# Patient Record
Sex: Male | Born: 1965
Health system: Southern US, Community
[De-identification: ages and names within clinical notes are randomized; demographics above are authoritative.]

## PROBLEM LIST (undated history)

## (undated) DIAGNOSIS — Z789 Other specified health status: Secondary | ICD-10-CM

## (undated) HISTORY — PX: EYE SURGERY: SHX253

## (undated) HISTORY — DX: Other specified health status: Z78.9

---

## 2011-07-24 ENCOUNTER — Encounter (HOSPITAL_BASED_OUTPATIENT_CLINIC_OR_DEPARTMENT_OTHER): Payer: Self-pay | Admitting: *Deleted

## 2011-07-24 ENCOUNTER — Emergency Department (HOSPITAL_BASED_OUTPATIENT_CLINIC_OR_DEPARTMENT_OTHER)
Admission: EM | Admit: 2011-07-24 | Discharge: 2011-07-24 | Disposition: A | Discharge status: Home / Self Care | Payer: No Typology Code available for payment source | Attending: Emergency Medicine | Admitting: Emergency Medicine

## 2011-07-24 DIAGNOSIS — Z043 Encounter for examination and observation following other accident: Secondary | ICD-10-CM | POA: Insufficient documentation

## 2011-07-24 NOTE — ED Notes (Signed)
MD at bedside. 
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
## 2015-12-15 DIAGNOSIS — H524 Presbyopia: Secondary | ICD-10-CM | POA: Diagnosis not present

## 2015-12-15 DIAGNOSIS — H5203 Hypermetropia, bilateral: Secondary | ICD-10-CM | POA: Diagnosis not present

## 2016-08-26 ENCOUNTER — Ambulatory Visit (AMBULATORY_SURGERY_CENTER): Payer: Self-pay

## 2016-08-26 VITALS — Ht 72.0 in | Wt 203.0 lb

## 2016-08-26 DIAGNOSIS — Z1211 Encounter for screening for malignant neoplasm of colon: Secondary | ICD-10-CM

## 2016-08-26 MED ORDER — NA SULFATE-K SULFATE-MG SULF 17.5-3.13-1.6 GM/177ML PO SOLN: 1.0000 | Freq: Once | ORAL | 0 refills | Status: AC

## 2016-08-26 MED FILL — SUPREP BOWEL PREP KIT: 17.5-3.13-1 | 2 days supply | Qty: 354 | Fill #0

## 2016-08-26 NOTE — Progress Notes (Signed)
Denies allergies to eggs or soy products. Denies complication of anesthesia or sedation. Denies use of weight loss medication. Denies use of O2.   Emmi instructions declined.  

## 2016-09-06 ENCOUNTER — Encounter: Payer: Self-pay | Admitting: Internal Medicine

## 2016-09-20 ENCOUNTER — Encounter: Payer: Self-pay | Admitting: Internal Medicine

## 2016-09-20 ENCOUNTER — Ambulatory Visit (AMBULATORY_SURGERY_CENTER): Payer: 59 | Admitting: Internal Medicine

## 2016-09-20 VITALS — BP 136/83 | HR 56 | Temp 97.5°F | Resp 13 | Ht 72.0 in | Wt 203.0 lb

## 2016-09-20 DIAGNOSIS — Z1212 Encounter for screening for malignant neoplasm of rectum: Secondary | ICD-10-CM | POA: Diagnosis not present

## 2016-09-20 DIAGNOSIS — Z1211 Encounter for screening for malignant neoplasm of colon: Secondary | ICD-10-CM

## 2016-09-20 MED ORDER — SODIUM CHLORIDE 0.9 % IV SOLN: 500.0000 mL | INTRAVENOUS | Status: DC

## 2016-09-20 NOTE — Patient Instructions (Signed)
YOU HAD AN ENDOSCOPIC PROCEDURE TODAY AT THE [facility]:   Refer to the procedure report that was given to you for any specific questions about what was found during the examination.  If the procedure report does not answer your questions, please call your gastroenterologist to clarify.  If you requested that your care partner not be given the details of your procedure findings, then the procedure report has been included in a sealed envelope for you to review at your convenience later.  YOU SHOULD EXPECT: Some feelings of bloating in the abdomen. Passage of more gas than usual.  Walking can help get rid of the air that was put into your GI tract during the procedure and reduce the bloating. If you had a lower endoscopy (such as a colonoscopy or flexible sigmoidoscopy) you may notice spotting of blood in your stool or on the toilet paper. If you underwent a bowel prep for your procedure, you may not have a normal bowel movement for a few days.  Please Note:  You might notice some irritation and congestion in your nose or some drainage.  This is from the oxygen used during your procedure.  There is no need for concern and it should clear up in a day or so.  SYMPTOMS TO REPORT IMMEDIATELY:   Following lower endoscopy (colonoscopy or flexible sigmoidoscopy):  Excessive amounts of blood in the stool  Significant tenderness or worsening of abdominal pains  Swelling of the abdomen that is new, acute  Fever of 100F or higher  For urgent or emergent issues, a gastroenterologist can be reached at any hour by calling [phone].   DIET:  We do recommend a small meal at first, but then you may proceed to your regular diet.  Drink plenty of fluids but you should avoid alcoholic beverages for 24 hours.  ACTIVITY:  You should plan to take it easy for the rest of today and you should NOT DRIVE or use heavy machinery until tomorrow (because of the sedation medicines used during the test).     FOLLOW UP: Our staff will call the number listed on your records the next business day following your procedure to check on you and address any questions or concerns that you may have regarding the information given to you following your procedure. If we do not reach you, we will leave a message.  However, if you are feeling well and you are not experiencing any problems, there is no need to return our call.  We will assume that you have returned to your regular daily activities without incident.  If any biopsies were taken you will be contacted by phone or by letter within the next 1-3 weeks.  Please call us at [phone] if you have not heard about the biopsies in 3 weeks.   Hemorrhoids (handout given) Repeat Colonoscopy in 10 years   SIGNATURES/CONFIDENTIALITY: You and/or your care partner have signed paperwork which will be entered into your electronic medical record.  These signatures attest to the fact that that the information above on your After Visit Summary has been reviewed and is understood.  Full responsibility of the confidentiality of this discharge information lies with you and/or your care-partner.

## 2016-09-20 NOTE — Progress Notes (Signed)
Pt's states no medical or surgical changes since previsit or office visit. maw 

## 2016-09-20 NOTE — Progress Notes (Signed)
Alert and oriented x3, pleased with MAC, report to RN celia 

## 2016-09-20 NOTE — Op Note (Signed)
[name] Procedure Date: 09/20/2016 10:47 AM MRN: [id number] Endoscopist: Beverley Fiedler , MD Age: 51 Referring MD:  Date of Birth: 22-Oct-1965 Gender: Male Account #: [id number] Procedure:                Colonoscopy Indications:              Screening for colorectal malignant neoplasm, This                            is the patient's first colonoscopy Medicines:                Monitored Anesthesia Care Procedure:                Pre-Anesthesia Assessment:                           - Prior to the procedure, a History and Physical                            was performed, and patient medications and                            allergies were reviewed. The patient's tolerance of                            previous anesthesia was also reviewed. The risks                            and benefits of the procedure and the sedation                            options and risks were discussed with the patient.                            All questions were answered, and informed consent                            was obtained. Prior Anticoagulants: The patient has                            taken no previous anticoagulant or antiplatelet                            agents. ASA Grade Assessment: I - A normal, healthy                            patient. After reviewing the risks and benefits,                            the patient was deemed in satisfactory condition to                            undergo the procedure.  After obtaining informed consent, the colonoscope                            was passed under direct vision. Throughout the                            procedure, the patient's blood pressure, pulse, and                            oxygen saturations were monitored continuously. The                            Colonoscope was introduced through the anus and                            advanced to the the cecum, identified by                             appendiceal orifice and ileocecal valve. The                            colonoscopy was performed without difficulty. The                            patient tolerated the procedure well. The quality                            of the bowel preparation was good. The terminal                            ileum, ileocecal valve, appendiceal orifice, and                            rectum were photographed. Scope In: 11:04:20 AM Scope Out: 11:16:36 AM Scope Withdrawal Time: 0 hours 10 minutes 18 seconds  Total Procedure Duration: 0 hours 12 minutes 16 seconds  Findings:                 The digital rectal exam was normal.                           The terminal ileum appeared normal.                           The colon (entire examined portion) appeared normal.                           Internal hemorrhoids were found during                            retroflexion. The hemorrhoids were small. Complications:            No immediate complications. Estimated Blood Loss:     Estimated blood loss: none. Impression:               - The examined portion of the ileum  was normal.                           - The entire examined colon is normal.                           - Small internal hemorrhoids.                           - No specimens collected. Recommendation:           - Patient has a contact number available for                            emergencies. The signs and symptoms of potential                            delayed complications were discussed with the                            patient. Return to normal activities tomorrow.                            Written discharge instructions were provided to the                            patient.                           - Resume previous diet.                           - Continue present medications.                           - Repeat colonoscopy in 10 years for screening                            purposes. Beverley FiedlerJay M Nick Armel,  MD 09/20/2016 11:19:03 AM This report has been signed electronically.

## 2016-09-21 ENCOUNTER — Telehealth: Payer: Self-pay | Admitting: *Deleted

## 2016-09-21 ENCOUNTER — Telehealth: Payer: Self-pay

## 2016-09-21 NOTE — Telephone Encounter (Signed)
No answer, message left for the patient. 

## 2016-09-21 NOTE — Telephone Encounter (Signed)
  Follow up Call-  Call back number 09/20/2016  Post procedure Call Back phone  # #[phone] cell  Permission to leave phone message Yes  Some recent data might be hidden     Patient questions:  Do you have a fever, pain , or abdominal swelling? No. Pain Score  0 *  Have you tolerated food without any problems? Yes.    Have you been able to return to your normal activities? Yes.    Do you have any questions about your discharge instructions: Diet   No. Medications  No. Follow up visit  No.  Do you have questions or concerns about your Care? No.  Actions: * If pain score is 4 or above: No action needed, pain <4.

## 2017-01-17 DIAGNOSIS — H5203 Hypermetropia, bilateral: Secondary | ICD-10-CM | POA: Diagnosis not present

## 2017-01-17 DIAGNOSIS — H524 Presbyopia: Secondary | ICD-10-CM | POA: Diagnosis not present

## 2017-09-18 ENCOUNTER — Ambulatory Visit (INDEPENDENT_AMBULATORY_CARE_PROVIDER_SITE_OTHER): Payer: 59 | Admitting: Family Medicine

## 2017-09-18 ENCOUNTER — Encounter: Payer: Self-pay | Admitting: Family Medicine

## 2017-09-18 VITALS — BP 132/86 | HR 69 | Temp 98.4°F | Ht 72.0 in | Wt 207.0 lb

## 2017-09-18 DIAGNOSIS — Z2821 Immunization not carried out because of patient refusal: Secondary | ICD-10-CM

## 2017-09-18 DIAGNOSIS — Z114 Encounter for screening for human immunodeficiency virus [HIV]: Secondary | ICD-10-CM | POA: Diagnosis not present

## 2017-09-18 DIAGNOSIS — Z0000 Encounter for general adult medical examination without abnormal findings: Secondary | ICD-10-CM | POA: Diagnosis not present

## 2017-09-18 DIAGNOSIS — L309 Dermatitis, unspecified: Secondary | ICD-10-CM

## 2017-09-18 LAB — COMPREHENSIVE METABOLIC PANEL
ALBUMIN: 4.5 g/dL (ref 3.5–5.2)
ALK PHOS: 58 U/L (ref 39–117)
ALT: 32 U/L (ref 0–53)
AST: 23 U/L (ref 0–37)
BUN: 14 mg/dL (ref 6–23)
CO2: 28 mEq/L (ref 19–32)
Calcium: 9.7 mg/dL (ref 8.4–10.5)
Chloride: 104 mEq/L (ref 96–112)
Creatinine, Ser: 1.1 mg/dL (ref 0.40–1.50)
GFR: 75.05 mL/min (ref >60.00)
Glucose, Bld: 88 mg/dL (ref 70–99)
Potassium: 4.1 mEq/L (ref 3.5–5.1)
Sodium: 139 mEq/L (ref 135–145)
Total Bilirubin: 0.8 mg/dL (ref 0.2–1.2)
Total Protein: 7 g/dL (ref 6.0–8.3)

## 2017-09-18 LAB — LIPID PANEL
CHOLESTEROL: 203 mg/dL — AB (ref 0–200)
HDL: 35 mg/dL — AB (ref >39.00)
LDL Cholesterol: 136 mg/dL — ABNORMAL HIGH (ref 0–99)
NONHDL: 168.36
Total CHOL/HDL Ratio: 6
Triglycerides: 160 mg/dL — ABNORMAL HIGH (ref 0.0–149.0)
VLDL: 32 mg/dL (ref 0.0–40.0)

## 2017-09-18 MED ORDER — TRIAMCINOLONE ACETONIDE 0.1 % EX CREA: 1.0000 "application " | TOPICAL_CREAM | Freq: Two times a day (BID) | CUTANEOUS | 0 refills | Status: DC

## 2017-09-18 NOTE — Progress Notes (Signed)
Chief Complaint  Patient presents with  . New Patient (Initial Visit)    Well Male Derek Peterson is here for a complete physical.   His last physical was >1 year ago.  Current diet: in general, a "fair to poor" diet.  Current exercise: None currently Weight trend: Gaining  Does pt snore? No apneic episodes Daytime fatigue? No.  Seat belt? Yes.    Health maintenance Shingrix- No Colonoscopy- Yes Tetanus- No- refuses HIV- No Prostate cancer screening- No   Past Medical History:  Diagnosis Date  . No known health problems       Past Surgical History:  Procedure Laterality Date  . EYE SURGERY      Medications  Takes no meds routinely.   Allergies No Known Allergies  Family History Family History  Adopted: Yes  Family history unknown: Yes    Review of Systems: Constitutional:  no fevers Eye:  no recent significant change in vision Ear/Nose/Mouth/Throat:  Ears:  no hearing loss Nose/Mouth/Throat:  no complaints of nasal congestion, no sore throat Cardiovascular:  no chest pain, no palpitations Respiratory:  no cough and no shortness of breath Gastrointestinal:  no abdominal pain, no change in bowel habits GU:  Male: negative for dysuria, frequency, and incontinence and negative for prostate symptoms Musculoskeletal/Extremities:  no pain, redness, or swelling of the joints Integumentary (Skin/Breast): +itchy lesion on RLE; otherwise no abnormal skin lesions reported Neurologic:  no headaches Endocrine: No unexpected weight changes Hematologic/Lymphatic:  no abnormal bleeding  Exam BP 132/86 (BP Location: Left Arm, Patient Position: Sitting, Cuff Size: Large)   Pulse 69   Temp 98.4 F (36.9 C) (Oral)   Ht 6' (1.829 m)   Wt 207 lb (93.9 kg)   SpO2 97%   BMI 28.07 kg/m  General:  well developed, well nourished, in no apparent distress Skin: patch of erythema and excoriation on R lateral LE distally; otherwise no significant moles, warts, or growths Head:   no masses, lesions, or tenderness Eyes:  pupils equal and round, sclera anicteric without injection Ears:  canals without lesions, TMs shiny without retraction, no obvious effusion, no erythema Nose:  nares patent, septum midline, mucosa normal Throat/Pharynx:  lips and gingiva without lesion; tongue and uvula midline; non-inflamed pharynx; no exudates or postnasal drainage Neck: neck supple without adenopathy, thyromegaly, or masses Lungs:  clear to auscultation, breath sounds equal bilaterally, no respiratory distress Cardio:  regular rate and rhythm, no LE edema, no bruits Abdomen:  abdomen soft, nontender; bowel sounds normal; no masses or organomegaly Genital (male): circumcised penis, no lesions or discharge; testes present bilaterally without masses or tenderness Rectal: Deferred Musculoskeletal:  symmetrical muscle groups noted without atrophy or deformity Extremities:  no clubbing, cyanosis, or edema, no deformities, no skin discoloration Neuro:  gait normal; deep tendon reflexes normal and symmetric Psych: well oriented with normal range of affect and appropriate judgment/insight  Assessment and Plan  Well adult exam - Plan: Lipid panel, Comprehensive metabolic panel  Screening for HIV (human immunodeficiency virus) - Plan: HIV antibody  Dermatitis - Plan: triamcinolone cream (KENALOG) 0.1 %  Tetanus, diphtheria, and acellular pertussis (Tdap) vaccination declined   Well 52 y.o. male. Counseled on diet and exercise. Needs to get back in swing of things.  Counseled on risks and benefits of prostate cancer screening with PSA. The patient agrees to forego testing. Immunizations, labs, and further orders as above. Let us know if he changes mind about tdap. Rec'd he contact pharmacy if interested in Shingrix.  Follow up in 1 yr or prn. The patient voiced understanding and agreement to the plan.  Derek Roche [place], DO 09/18/17 1:50 PM

## 2017-09-18 NOTE — Progress Notes (Signed)
Pre visit review using our clinic review tool, if applicable. No additional management support is needed unless otherwise documented below in the visit note. 

## 2017-09-18 NOTE — Patient Instructions (Addendum)
Call your pharmacy to see about the availability of the new shingles vaccine (Shingrix).  Get back on the wagon for your eating and exercising.  Keep up the good work otherwise.  Let me know if you change your mind about the tetanus shot.  Let us know if you need anything.

## 2017-09-19 LAB — HIV ANTIBODY (ROUTINE TESTING W REFLEX): HIV 1&2 Ab, 4th Generation: NONREACTIVE

## 2017-09-26 MED FILL — TRIAMCINOLONE 0.1% CREAM: 0.1 | 30 days supply | Qty: 30 | Fill #0

## 2018-08-07 DIAGNOSIS — H524 Presbyopia: Secondary | ICD-10-CM | POA: Diagnosis not present

## 2018-08-07 DIAGNOSIS — H5203 Hypermetropia, bilateral: Secondary | ICD-10-CM | POA: Diagnosis not present

## 2018-08-07 DIAGNOSIS — H52222 Regular astigmatism, left eye: Secondary | ICD-10-CM | POA: Diagnosis not present

## 2018-09-07 ENCOUNTER — Emergency Department (HOSPITAL_COMMUNITY)
Admission: EM | Admit: 2018-09-07 | Discharge: 2018-09-08 | Disposition: A | Discharge status: Home / Self Care | Payer: 59 | Attending: Emergency Medicine | Admitting: Emergency Medicine

## 2018-09-07 ENCOUNTER — Emergency Department (HOSPITAL_COMMUNITY): Payer: 59

## 2018-09-07 ENCOUNTER — Encounter (HOSPITAL_COMMUNITY): Payer: Self-pay | Admitting: *Deleted

## 2018-09-07 ENCOUNTER — Other Ambulatory Visit: Payer: Self-pay

## 2018-09-07 DIAGNOSIS — R0789 Other chest pain: Secondary | ICD-10-CM | POA: Diagnosis not present

## 2018-09-07 DIAGNOSIS — Y929 Unspecified place or not applicable: Secondary | ICD-10-CM | POA: Diagnosis not present

## 2018-09-07 DIAGNOSIS — Y939 Activity, unspecified: Secondary | ICD-10-CM | POA: Diagnosis not present

## 2018-09-07 DIAGNOSIS — S80211A Abrasion, right knee, initial encounter: Secondary | ICD-10-CM | POA: Diagnosis not present

## 2018-09-07 DIAGNOSIS — R52 Pain, unspecified: Secondary | ICD-10-CM | POA: Diagnosis not present

## 2018-09-07 DIAGNOSIS — S42101A Fracture of unspecified part of scapula, right shoulder, initial encounter for closed fracture: Secondary | ICD-10-CM | POA: Insufficient documentation

## 2018-09-07 DIAGNOSIS — S42024A Nondisplaced fracture of shaft of right clavicle, initial encounter for closed fracture: Secondary | ICD-10-CM | POA: Insufficient documentation

## 2018-09-07 DIAGNOSIS — S0990XA Unspecified injury of head, initial encounter: Secondary | ICD-10-CM | POA: Diagnosis not present

## 2018-09-07 DIAGNOSIS — S80212A Abrasion, left knee, initial encounter: Secondary | ICD-10-CM | POA: Insufficient documentation

## 2018-09-07 DIAGNOSIS — Z23 Encounter for immunization: Secondary | ICD-10-CM | POA: Diagnosis not present

## 2018-09-07 DIAGNOSIS — S3991XA Unspecified injury of abdomen, initial encounter: Secondary | ICD-10-CM | POA: Diagnosis not present

## 2018-09-07 DIAGNOSIS — Y999 Unspecified external cause status: Secondary | ICD-10-CM | POA: Diagnosis not present

## 2018-09-07 DIAGNOSIS — S199XXA Unspecified injury of neck, initial encounter: Secondary | ICD-10-CM | POA: Diagnosis not present

## 2018-09-07 DIAGNOSIS — S299XXA Unspecified injury of thorax, initial encounter: Secondary | ICD-10-CM | POA: Diagnosis not present

## 2018-09-07 DIAGNOSIS — T07XXXA Unspecified multiple injuries, initial encounter: Secondary | ICD-10-CM | POA: Diagnosis present

## 2018-09-07 DIAGNOSIS — I10 Essential (primary) hypertension: Secondary | ICD-10-CM | POA: Diagnosis not present

## 2018-09-07 DIAGNOSIS — S42111A Displaced fracture of body of scapula, right shoulder, initial encounter for closed fracture: Secondary | ICD-10-CM | POA: Diagnosis not present

## 2018-09-07 DIAGNOSIS — S3993XA Unspecified injury of pelvis, initial encounter: Secondary | ICD-10-CM | POA: Diagnosis not present

## 2018-09-07 LAB — CBC
HCT: 42.7 % (ref 39.0–52.0)
Hemoglobin: 14.6 g/dL (ref 13.0–17.0)
MCH: 30 pg (ref 26.0–34.0)
MCHC: 34.2 g/dL (ref 30.0–36.0)
MCV: 87.9 fL (ref 80.0–100.0)
Platelets: 256 10*3/uL (ref 150–400)
RBC: 4.86 MIL/uL (ref 4.22–5.81)
RDW: 12.2 % (ref 11.5–15.5)
WBC: 8.6 10*3/uL (ref 4.0–10.5)
nRBC: 0 % (ref 0.0–0.2)

## 2018-09-07 LAB — I-STAT CHEM 8, ED
BUN: 11 mg/dL (ref 6–20)
Calcium, Ion: 1.07 mmol/L — ABNORMAL LOW (ref 1.15–1.40)
Chloride: 104 mmol/L (ref 98–111)
Creatinine, Ser: 1.1 mg/dL (ref 0.61–1.24)
Glucose, Bld: 115 mg/dL — ABNORMAL HIGH (ref 70–99)
HCT: 43 % (ref 39.0–52.0)
Hemoglobin: 14.6 g/dL (ref 13.0–17.0)
Potassium: 3.8 mmol/L (ref 3.5–5.1)
Sodium: 139 mmol/L (ref 135–145)
TCO2: 26 mmol/L (ref 22–32)

## 2018-09-07 LAB — COMPREHENSIVE METABOLIC PANEL
ALT: 56 U/L — ABNORMAL HIGH (ref 0–44)
AST: 59 U/L — ABNORMAL HIGH (ref 15–41)
Albumin: 4.2 g/dL (ref 3.5–5.0)
Alkaline Phosphatase: 58 U/L (ref 38–126)
Anion gap: 8 (ref 5–15)
BUN: 11 mg/dL (ref 6–20)
CO2: 24 mmol/L (ref 22–32)
Calcium: 9.2 mg/dL (ref 8.9–10.3)
Chloride: 106 mmol/L (ref 98–111)
Creatinine, Ser: 1.1 mg/dL (ref 0.61–1.24)
GFR calc Af Amer: >60 mL/min (ref >60)
GFR calc non Af Amer: >60 mL/min (ref >60)
Glucose, Bld: 119 mg/dL — ABNORMAL HIGH (ref 70–99)
Potassium: 3.7 mmol/L (ref 3.5–5.1)
Sodium: 138 mmol/L (ref 135–145)
Total Bilirubin: 0.5 mg/dL (ref 0.3–1.2)
Total Protein: 7.1 g/dL (ref 6.5–8.1)

## 2018-09-07 LAB — SAMPLE TO BLOOD BANK

## 2018-09-07 LAB — ETHANOL: Alcohol, Ethyl (B): <10 mg/dL (ref <10)

## 2018-09-07 LAB — PROTIME-INR
INR: 1 (ref 0.8–1.2)
Prothrombin Time: 13 seconds (ref 11.4–15.2)

## 2018-09-07 LAB — LACTIC ACID, PLASMA: Lactic Acid, Venous: 1.9 mmol/L (ref 0.5–1.9)

## 2018-09-07 LAB — CDS SEROLOGY

## 2018-09-07 MED ORDER — OXYCODONE-ACETAMINOPHEN 5-325 MG PO TABS: 2.0000 | ORAL_TABLET | ORAL | 0 refills | Status: AC | PRN

## 2018-09-07 MED ORDER — KETOROLAC TROMETHAMINE 30 MG/ML IJ SOLN
30.0000 mg | Freq: Once | INTRAMUSCULAR | Status: AC
  Administered 2018-09-07: 30 mg via INTRAVENOUS
  Filled 2018-09-07: qty 1

## 2018-09-07 MED ORDER — MORPHINE SULFATE (PF) 4 MG/ML IV SOLN
4.0000 mg | INTRAVENOUS | Status: DC | PRN
  Administered 2018-09-07: 21:00:00 4 mg via INTRAVENOUS

## 2018-09-07 MED ORDER — FENTANYL CITRATE (PF) 100 MCG/2ML IJ SOLN
INTRAMUSCULAR | Status: AC
  Filled 2018-09-07: qty 2

## 2018-09-07 MED ORDER — TETANUS-DIPHTH-ACELL PERTUSSIS 5-2.5-18.5 LF-MCG/0.5 IM SUSP
0.5000 mL | Freq: Once | INTRAMUSCULAR | Status: AC
  Administered 2018-09-07: 0.5 mL via INTRAMUSCULAR
  Filled 2018-09-07: qty 0.5

## 2018-09-07 MED ORDER — MORPHINE SULFATE (PF) 4 MG/ML IV SOLN
INTRAVENOUS | Status: AC
  Administered 2018-09-07: 4 mg via INTRAVENOUS
  Filled 2018-09-07: qty 1

## 2018-09-07 MED ORDER — IOHEXOL 300 MG/ML  SOLN
100.0000 mL | Freq: Once | INTRAMUSCULAR | Status: AC | PRN
  Administered 2018-09-07: 100 mL via INTRAVENOUS

## 2018-09-07 MED ORDER — OXYCODONE-ACETAMINOPHEN 5-325 MG PO TABS
2.0000 | ORAL_TABLET | Freq: Once | ORAL | Status: AC
  Administered 2018-09-07: 2 via ORAL
  Filled 2018-09-07: qty 2
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
## 2018-09-07 NOTE — ED Notes (Signed)
Pt returned from ct, repositioned. Pt asking about removing c collar, waiting to speak with dr. Rex Kras at this time.
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
## 2018-09-07 NOTE — ED Notes (Signed)
Pt's wife, Derek Peterson in car, to be contacted with updates [phone]

## 2018-09-07 NOTE — ED Notes (Signed)
xrays still being completed at this time

## 2018-09-07 NOTE — ED Notes (Signed)
Pt requesting water, waiting for provider update at this time.

## 2018-09-07 NOTE — Progress Notes (Signed)
Orthopedic Tech Progress Note Patient Details:  Derek Peterson [birth year] [id number]  Ortho Devices Type of Ortho Device: Sling immobilizer Ortho Device/Splint Interventions: Adjustment, Application, Ordered   Post Interventions Patient Tolerated: Well Instructions Provided: Care of device, Adjustment of device   Melony Overly T 09/07/2018, 11:06 PM

## 2018-09-08 NOTE — ED Notes (Signed)
Pt d/c with his wife in private vehicle, instructions were also given to pt spouse
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
## 2018-09-10 ENCOUNTER — Encounter: Payer: Self-pay | Admitting: Family Medicine

## 2018-09-12 DIAGNOSIS — S42024A Nondisplaced fracture of shaft of right clavicle, initial encounter for closed fracture: Secondary | ICD-10-CM | POA: Diagnosis not present

## 2018-09-12 DIAGNOSIS — S42111A Displaced fracture of body of scapula, right shoulder, initial encounter for closed fracture: Secondary | ICD-10-CM | POA: Diagnosis not present

## 2018-09-19 DIAGNOSIS — S42024D Nondisplaced fracture of shaft of right clavicle, subsequent encounter for fracture with routine healing: Secondary | ICD-10-CM | POA: Diagnosis not present

## 2018-09-19 DIAGNOSIS — S42111D Displaced fracture of body of scapula, right shoulder, subsequent encounter for fracture with routine healing: Secondary | ICD-10-CM | POA: Diagnosis not present

## 2018-09-24 ENCOUNTER — Encounter: Payer: 59 | Admitting: Family Medicine

## 2018-10-12 DIAGNOSIS — S42101A Fracture of unspecified part of scapula, right shoulder, initial encounter for closed fracture: Secondary | ICD-10-CM | POA: Insufficient documentation

## 2018-10-12 DIAGNOSIS — S42021A Displaced fracture of shaft of right clavicle, initial encounter for closed fracture: Secondary | ICD-10-CM | POA: Insufficient documentation

## 2018-10-17 DIAGNOSIS — S42111D Displaced fracture of body of scapula, right shoulder, subsequent encounter for fracture with routine healing: Secondary | ICD-10-CM | POA: Diagnosis not present

## 2018-10-17 DIAGNOSIS — S42024D Nondisplaced fracture of shaft of right clavicle, subsequent encounter for fracture with routine healing: Secondary | ICD-10-CM | POA: Diagnosis not present

## 2018-11-14 DIAGNOSIS — S42111D Displaced fracture of body of scapula, right shoulder, subsequent encounter for fracture with routine healing: Secondary | ICD-10-CM | POA: Diagnosis not present

## 2018-11-14 DIAGNOSIS — S42024D Nondisplaced fracture of shaft of right clavicle, subsequent encounter for fracture with routine healing: Secondary | ICD-10-CM | POA: Diagnosis not present

## 2018-11-27 ENCOUNTER — Ambulatory Visit: Payer: 59 | Attending: Orthopedic Surgery | Admitting: Physical Therapy

## 2018-11-27 ENCOUNTER — Encounter: Payer: Self-pay | Admitting: Physical Therapy

## 2018-11-27 ENCOUNTER — Other Ambulatory Visit: Payer: Self-pay

## 2018-11-27 DIAGNOSIS — M25611 Stiffness of right shoulder, not elsewhere classified: Secondary | ICD-10-CM | POA: Diagnosis not present

## 2018-11-27 DIAGNOSIS — R252 Cramp and spasm: Secondary | ICD-10-CM | POA: Diagnosis not present

## 2018-11-27 DIAGNOSIS — M25511 Pain in right shoulder: Secondary | ICD-10-CM | POA: Diagnosis not present

## 2018-11-27 NOTE — Patient Instructions (Signed)
Access Code: [email or web address]  URL: [email or web address]  Date: 11/27/2018  Prepared by: Lum Babe   Exercises  Standing Shoulder Flexion Wall Walk - 10 reps - 1 sets - 10 hold - 2-3x daily - 7x weekly  Standing Shoulder Abduction AAROM with Dowel - 10 reps - 1 sets - 10 hold - 2-3x daily - 7x weekly  Seated Shoulder External Rotation PROM on Table - 10 reps - 1 sets - 10 hold - 2-3x daily - 7x weekly  Circular Shoulder Pendulum with Table Support - 10 reps - 1 sets - 2 hold - 2-3x daily - 7x weekly  Squatting Shoulder Row with Anchored Resistance - 10 reps - 1 sets - 2 hold - 2-3x daily - 7x weekly  Shoulder extension with resistance - Neutral - 10 reps - 1 sets - 3 hold - 2-3x daily - 7x weekly  Standing Shoulder Internal Rotation Stretch with Hands Behind Back - 10 reps - 1 sets - 10 hold - 2-3x daily - 7x weekly
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
## 2018-12-14 DIAGNOSIS — M25511 Pain in right shoulder: Secondary | ICD-10-CM | POA: Diagnosis not present

## 2018-12-14 DIAGNOSIS — S42111D Displaced fracture of body of scapula, right shoulder, subsequent encounter for fracture with routine healing: Secondary | ICD-10-CM | POA: Diagnosis not present

## 2018-12-14 DIAGNOSIS — S42024D Nondisplaced fracture of shaft of right clavicle, subsequent encounter for fracture with routine healing: Secondary | ICD-10-CM | POA: Diagnosis not present

## 2018-12-21 DIAGNOSIS — M25511 Pain in right shoulder: Secondary | ICD-10-CM | POA: Diagnosis not present

## 2018-12-28 DIAGNOSIS — S42111D Displaced fracture of body of scapula, right shoulder, subsequent encounter for fracture with routine healing: Secondary | ICD-10-CM | POA: Diagnosis not present

## 2018-12-28 DIAGNOSIS — M25511 Pain in right shoulder: Secondary | ICD-10-CM | POA: Diagnosis not present

## 2018-12-28 DIAGNOSIS — S42024D Nondisplaced fracture of shaft of right clavicle, subsequent encounter for fracture with routine healing: Secondary | ICD-10-CM | POA: Diagnosis not present
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
## 2020-10-06 ENCOUNTER — Other Ambulatory Visit: Payer: Self-pay

## 2020-10-06 ENCOUNTER — Ambulatory Visit: Payer: 59 | Admitting: Medical

## 2020-10-06 ENCOUNTER — Encounter: Payer: Self-pay | Admitting: Medical

## 2020-10-06 VITALS — BP 133/80 | HR 68 | Temp 98.0°F | Resp 18 | Ht 72.0 in | Wt 201.0 lb

## 2020-10-06 DIAGNOSIS — Z0000 Encounter for general adult medical examination without abnormal findings: Secondary | ICD-10-CM | POA: Diagnosis not present

## 2020-10-06 DIAGNOSIS — Z125 Encounter for screening for malignant neoplasm of prostate: Secondary | ICD-10-CM | POA: Diagnosis not present

## 2020-10-06 LAB — CBC WITH DIFFERENTIAL/PLATELET
Basophils Absolute: 0.1 10*3/uL (ref 0.0–0.1)
Basophils Relative: 1 % (ref 0.0–3.0)
Eosinophils Absolute: 0.3 10*3/uL (ref 0.0–0.7)
Eosinophils Relative: 4.7 % (ref 0.0–5.0)
HCT: 43.1 % (ref 39.0–52.0)
Hemoglobin: 14.9 g/dL (ref 13.0–17.0)
Lymphocytes Relative: 37.1 % (ref 12.0–46.0)
Lymphs Abs: 2.3 10*3/uL (ref 0.7–4.0)
MCHC: 34.6 g/dL (ref 30.0–36.0)
MCV: 89.8 fl (ref 78.0–100.0)
Monocytes Absolute: 0.5 10*3/uL (ref 0.1–1.0)
Monocytes Relative: 7.6 % (ref 3.0–12.0)
Neutro Abs: 3 10*3/uL (ref 1.4–7.7)
Neutrophils Relative %: 49.6 % (ref 43.0–77.0)
Platelets: 250 10*3/uL (ref 150.0–400.0)
RBC: 4.8 Mil/uL (ref 4.22–5.81)
RDW: 13 % (ref 11.5–15.5)
WBC: 6.1 10*3/uL (ref 4.0–10.5)

## 2020-10-06 LAB — PSA: PSA: 0.34 ng/mL (ref 0.10–4.00)

## 2020-10-06 LAB — COMPREHENSIVE METABOLIC PANEL
ALT: 24 U/L (ref 0–53)
AST: 18 U/L (ref 0–37)
Albumin: 4.7 g/dL (ref 3.5–5.2)
Alkaline Phosphatase: 65 U/L (ref 39–117)
BUN: 19 mg/dL (ref 6–23)
CO2: 27 mEq/L (ref 19–32)
Calcium: 9.7 mg/dL (ref 8.4–10.5)
Chloride: 105 mEq/L (ref 96–112)
Creatinine, Ser: 1.21 mg/dL (ref 0.40–1.50)
GFR: 68.16 mL/min (ref >60.00)
Glucose, Bld: 88 mg/dL (ref 70–99)
Potassium: 4.2 mEq/L (ref 3.5–5.1)
Sodium: 139 mEq/L (ref 135–145)
Total Bilirubin: 0.7 mg/dL (ref 0.2–1.2)
Total Protein: 7.2 g/dL (ref 6.0–8.3)

## 2020-10-06 LAB — LIPID PANEL
Cholesterol: 214 mg/dL — ABNORMAL HIGH (ref 0–200)
HDL: 38.1 mg/dL — ABNORMAL LOW (ref >39.00)
LDL Cholesterol: 153 mg/dL — ABNORMAL HIGH (ref 0–99)
NonHDL: 175.88
Total CHOL/HDL Ratio: 6
Triglycerides: 115 mg/dL (ref 0.0–149.0)
VLDL: 23 mg/dL (ref 0.0–40.0)

## 2020-10-06 NOTE — Patient Instructions (Addendum)
For you wellness exam today I have ordered cbc, cmp and lipid panel.  Screening psa.  Covid vaccine and shingrix declined.  Recommend exercise and healthy diet.  We will let you know lab results as they come in.  Follow up date appointment will be determined after lab review.    Preventive Care 58-54 Years Old, Male  Preventive care refers to lifestyle choices and visits with your health care provider that can promote health and wellness. This includes: A yearly physical exam. This is also called an annual wellness visit. Regular dental and eye exams. Immunizations. Screening for certain conditions. Healthy lifestyle choices, such as: Eating a healthy diet. Getting regular exercise. Not using drugs or products that contain nicotine and tobacco. Limiting alcohol use. What can I expect for my preventive care visit? Physical exam Your health care provider will check your: Height and weight. These may be used to calculate your BMI (body mass index). BMI is a measurement that tells if you are at a healthy weight. Heart rate and blood pressure. Body temperature. Skin for abnormal spots. Counseling Your health care provider may ask you questions about your: Past medical problems. Family's medical history. Alcohol, tobacco, and drug use. Emotional well-being. Home life and relationship well-being. Sexual activity. Diet, exercise, and sleep habits. Work and work Astronomer. Access to firearms. What immunizations do I need?  Vaccines are usually given at various ages, according to a schedule. Your health care provider will recommend vaccines for you based on your age, medicalhistory, and lifestyle or other factors, such as travel or where you work. What tests do I need? Blood tests Lipid and cholesterol levels. These may be checked every 5 years, or more often if you are over 52 years old. Hepatitis C test. Hepatitis B test. Screening Lung cancer screening. You may have this  screening every year starting at age 49 if you have a 30-pack-year history of smoking and currently smoke or have quit within the past 15 years. Prostate cancer screening. Recommendations will vary depending on your family history and other risks. Genital exam to check for testicular cancer or hernias. Colorectal cancer screening. All adults should have this screening starting at age 17 and continuing until age 65. Your health care provider may recommend screening at age 64 if you are at increased risk. You will have tests every 1-10 years, depending on your results and the type of screening test. Diabetes screening. This is done by checking your blood sugar (glucose) after you have not eaten for a while (fasting). You may have this done every 1-3 years. STD (sexually transmitted disease) testing, if you are at risk. Follow these instructions at home: Eating and drinking  Eat a diet that includes fresh fruits and vegetables, whole grains, lean protein, and low-fat dairy products. Take vitamin and mineral supplements as recommended by your health care provider. Do not drink alcohol if your health care provider tells you not to drink. If you drink alcohol: Limit how much you have to 0-2 drinks a day. Be aware of how much alcohol is in your drink. In the [place] one drink equals one 12 oz bottle of beer (355 mL), one 5 oz glass of wine (148 mL), or one 1 oz glass of hard liquor (44 mL).  Lifestyle Take daily care of your teeth and gums. Brush your teeth every morning and night with fluoride toothpaste. Floss one time each day. Stay active. Exercise for at least 30 minutes 5 or more days each week. Do  not use any products that contain nicotine or tobacco, such as cigarettes, e-cigarettes, and chewing tobacco. If you need help quitting, ask your health care provider. Do not use drugs. If you are sexually active, practice safe sex. Use a condom or other form of protection to prevent STIs (sexually  transmitted infections). If told by your health care provider, take low-dose aspirin daily starting at age 89. Find healthy ways to cope with stress, such as: Meditation, yoga, or listening to music. Journaling. Talking to a trusted person. Spending time with friends and family. Safety Always wear your seat belt while driving or riding in a vehicle. Do not drive: If you have been drinking alcohol. Do not ride with someone who has been drinking. When you are tired or distracted. While texting. Wear a helmet and other protective equipment during sports activities. If you have firearms in your house, make sure you follow all gun safety procedures. What's next? Go to your health care provider once a year for an annual wellness visit. Ask your health care provider how often you should have your eyes and teeth checked. Stay up to date on all vaccines. This information is not intended to replace advice given to you by your health care provider. Make sure you discuss any questions you have with your healthcare provider. Document Revised: 12/11/2018 Document Reviewed: 03/08/2018 Elsevier Patient Education  2022 [employer].

## 2020-10-06 NOTE — Progress Notes (Signed)
   Subjective:    Patient ID: Derek Peterson, male    DOB: 1965-12-30, 55 y.o.   MRN: [id number]  HPI  Pt here for first time after more than 3 years.   Pt works at [employer] [place]. Pt has not working out regularly. Nonsmoker and no alcohol use. Diet is ok. But does one soda a day.  Pt has not eaten breakfast today.  Pt bp at work 130/80 typically.     Review of Systems  Constitutional:  Negative for chills, fatigue and fever.  HENT:  Negative for dental problem.   Respiratory:  Negative for cough, chest tightness, shortness of breath and wheezing.   Cardiovascular:  Negative for chest pain and palpitations.  Gastrointestinal:  Negative for abdominal distention and anal bleeding.  Genitourinary:  Negative for dysuria and frequency.  Musculoskeletal:  Negative for back pain, joint swelling, myalgias and neck stiffness.  Skin:  Negative for rash.  Neurological:  Negative for dizziness, numbness and headaches.  Hematological:  Negative for adenopathy. Does not bruise/bleed easily.  Psychiatric/Behavioral:  Negative for behavioral problems, decreased concentration, dysphoric mood, sleep disturbance and suicidal ideas. The patient is not nervous/anxious.      Past Medical History:  Diagnosis Date   No known health problems         Past Surgical History:  Procedure Laterality Date   EYE SURGERY      Family History  Adopted: Yes  Family history unknown: Yes    No Known Allergies  No current outpatient medications on file prior to visit.   No current facility-administered medications on file prior to visit.    BP 140/80   Pulse 68   Temp 98 F (36.7 C)   Resp 18   Ht 6' (1.829 m)   Wt 201 lb (91.2 kg)   SpO2 99%   BMI 27.26 kg/m        Objective:   Physical Exam  General Mental Status- Alert. General Appearance- Not in acute distress.   Skin General: Color- Normal Color. Moisture- Normal Moisture.  Neck Carotid Arteries-  Normal color. Moisture- Normal Moisture. No carotid bruits. No JVD.  Chest and Lung Exam Auscultation: Breath Sounds:-Normal.  Cardiovascular Auscultation:Rythm- Regular. Murmurs & Other Heart Sounds:Auscultation of the heart reveals- No Murmurs.  Abdomen Inspection:-Inspeection Normal. Palpation/Percussion:Note:No mass. Palpation and Percussion of the abdomen reveal- Non Tender, Non Distended + BS, no rebound or guarding.  Neurologic Cranial Nerve exam:- CN III-XII intact(No nystagmus), symmetric smile. Strength:- 5/5 equal and symmetric strength both upper and lower extremities.       Assessment & Plan:   For you wellness exam today I have ordered cbc, cmp and lipid panel.  Screening psa.  Covid vaccine and shingrix declined.  Recommend exercise and healthy diet.  We will let you know lab results as they come in.  Follow up date appointment will be determined after lab review.     Esperanza Richters, PA-C

## 2021-08-19 ENCOUNTER — Encounter: Payer: 59 | Admitting: Medical

## 2021-08-20 ENCOUNTER — Encounter: Payer: 59 | Admitting: Medical

## 2021-09-24 ENCOUNTER — Encounter: Payer: Self-pay | Admitting: Medical

## 2021-09-24 ENCOUNTER — Encounter: Payer: 59 | Admitting: Medical

## 2021-09-24 ENCOUNTER — Encounter: Payer: Self-pay | Admitting: *Deleted

## 2021-09-24 ENCOUNTER — Ambulatory Visit (INDEPENDENT_AMBULATORY_CARE_PROVIDER_SITE_OTHER): Payer: 59 | Admitting: Medical

## 2021-09-24 VITALS — BP 130/70 | HR 69 | Resp 18 | Ht 73.0 in | Wt 203.0 lb

## 2021-09-24 DIAGNOSIS — Z125 Encounter for screening for malignant neoplasm of prostate: Secondary | ICD-10-CM

## 2021-09-24 DIAGNOSIS — Z0000 Encounter for general adult medical examination without abnormal findings: Secondary | ICD-10-CM

## 2021-09-24 NOTE — Progress Notes (Signed)
Subjective:    Patient ID: Derek Peterson, male    DOB: [birth year], 56 y.o.   MRN: [id number]  HPI  Pt here for first time after more than 3 years.     Pt works at [employer] [place]. Pt has not working out regularly. Nonsmoker and no alcohol use. Diet is ok. But does one soda a day.   Pt has not eaten breakfast today.  Up to date on colonoscopy.  Declines shingrix.    Review of Systems  Constitutional:  Negative for chills, fatigue and fever.  HENT:  Negative for dental problem.   Respiratory:  Negative for cough, chest tightness, shortness of breath and wheezing.   Cardiovascular:  Negative for chest pain and palpitations.  Gastrointestinal:  Negative for abdominal distention and anal bleeding.  Genitourinary:  Negative for dysuria and frequency.  Musculoskeletal:  Negative for back pain, joint swelling, myalgias and neck stiffness.  Skin:  Negative for rash.  Neurological:  Negative for dizziness, numbness and headaches.  Hematological:  Negative for adenopathy. Does not bruise/bleed easily.  Psychiatric/Behavioral:  Negative for behavioral problems, decreased concentration, dysphoric mood, sleep disturbance and suicidal ideas. The patient is not nervous/anxious.      Past Medical History:  Diagnosis Date   No known health problems      Social History   Socioeconomic History   Marital status: Married    Spouse name: Not on file   Number of children: Not on file   Years of education: Not on file   Highest education level: Not on file  Occupational History   Not on file  Tobacco Use   Smoking status: Never   Smokeless tobacco: Never  Vaping Use   Vaping Use: Not on file  Substance and Sexual Activity   Alcohol use: Not on file   Drug use: No   Sexual activity: Not on file  Other Topics Concern   Not on file  Social History Narrative   ** Merged History Encounter **       Social Determinants of Health   Financial Resource Strain:  Not on file  Food Insecurity: Not on file  Transportation Needs: Not on file  Physical Activity: Not on file  Stress: Not on file  Social Connections: Not on file  Intimate Partner Violence: Not on file    Past Surgical History:  Procedure Laterality Date   EYE SURGERY      Family History  Adopted: Yes  Family history unknown: Yes    Allergies  Allergen Reactions   Crab Extract Allergy Skin Test    Shellfish Allergy     No current outpatient medications on file prior to visit.   No current facility-administered medications on file prior to visit.    BP 130/70   Pulse 69   Resp 18   Ht 6\' 1"  (1.854 m)   Wt 203 lb (92.1 kg)   SpO2 96%   BMI 26.78 kg/m           Objective:   Physical Exam  General Mental Status- Alert. General Appearance- Not in acute distress.   Skin Scatterd tiny freckls and small mole. No worrisome skin lesions/moles.  Neck Carotid Arteries- Normal color. Moisture- Normal Moisture. No carotid bruits. No JVD.  Chest and Lung Exam Auscultation: Breath Sounds:-Normal.  Cardiovascular Auscultation:Rythm- Regular. Murmurs & Other Heart Sounds:Auscultation of the heart reveals- No Murmurs.  Abdomen Inspection:-Inspeection Normal. Palpation/Percussion:Note:No mass. Palpation and Percussion of the abdomen reveal-  Non Tender, Non Distended + BS, no rebound or guarding.   Neurologic Cranial Nerve exam:- CN III-XII intact(No nystagmus), symmetric smile. Strength:- 5/5 equal and symmetric strength both upper and lower extremities.       Assessment & Plan:  For you wellness exam today I have ordered cbc, cmp and lipid panel. Include psa today.  Declined shingrix vaccine today.   Recommend exercise and healthy diet.  We will let you know lab results as they come in.  Follow up date appointment will be determined after lab review.    Better bp check 2nd time. Recommend check bp at home when relaxed.  Esperanza Richters, PA-C

## 2021-09-24 NOTE — Patient Instructions (Addendum)
For you wellness exam today I have ordered cbc, cmp and lipid panel. Include psa today.  Declined shingrix vaccine today.   Recommend exercise and healthy diet.  We will let you know lab results as they come in.  Follow up date appointment will be determined after lab review.    Better bp check 2nd time. Recommend check bp at home when relaxed.    Preventive Care 87-55 Years Old, Male Preventive care refers to lifestyle choices and visits with your health care provider that can promote health and wellness. Preventive care visits are also called wellness exams. What can I expect for my preventive care visit? Counseling During your preventive care visit, your health care provider may ask about your: Medical history, including: Past medical problems. Family medical history. Current health, including: Emotional well-being. Home life and relationship well-being. Sexual activity. Lifestyle, including: Alcohol, nicotine or tobacco, and drug use. Access to firearms. Diet, exercise, and sleep habits. Safety issues such as seatbelt and bike helmet use. Sunscreen use. Work and work Astronomer. Physical exam Your health care provider will check your: Height and weight. These may be used to calculate your BMI (body mass index). BMI is a measurement that tells if you are at a healthy weight. Waist circumference. This measures the distance around your waistline. This measurement also tells if you are at a healthy weight and may help predict your risk of certain diseases, such as type 2 diabetes and high blood pressure. Heart rate and blood pressure. Body temperature. Skin for abnormal spots. What immunizations do I need?  Vaccines are usually given at various ages, according to a schedule. Your health care provider will recommend vaccines for you based on your age, medical history, and lifestyle or other factors, such as travel or where you work. What tests do I need? Screening Your  health care provider may recommend screening tests for certain conditions. This may include: Lipid and cholesterol levels. Diabetes screening. This is done by checking your blood sugar (glucose) after you have not eaten for a while (fasting). Hepatitis B test. Hepatitis C test. HIV (human immunodeficiency virus) test. STI (sexually transmitted infection) testing, if you are at risk. Lung cancer screening. Prostate cancer screening. Colorectal cancer screening. Talk with your health care provider about your test results, treatment options, and if necessary, the need for more tests. Follow these instructions at home: Eating and drinking  Eat a diet that includes fresh fruits and vegetables, whole grains, lean protein, and low-fat dairy products. Take vitamin and mineral supplements as recommended by your health care provider. Do not drink alcohol if your health care provider tells you not to drink. If you drink alcohol: Limit how much you have to 0-2 drinks a day. Know how much alcohol is in your drink. In the [place] one drink equals one 12 oz bottle of beer (355 mL), one 5 oz glass of wine (148 mL), or one 1 oz glass of hard liquor (44 mL). Lifestyle Brush your teeth every morning and night with fluoride toothpaste. Floss one time each day. Exercise for at least 30 minutes 5 or more days each week. Do not use any products that contain nicotine or tobacco. These products include cigarettes, chewing tobacco, and vaping devices, such as e-cigarettes. If you need help quitting, ask your health care provider. Do not use drugs. If you are sexually active, practice safe sex. Use a condom or other form of protection to prevent STIs. Take aspirin only as told by your health care  provider. Make sure that you understand how much to take and what form to take. Work with your health care provider to find out whether it is safe and beneficial for you to take aspirin daily. Find healthy ways to manage  stress, such as: Meditation, yoga, or listening to music. Journaling. Talking to a trusted person. Spending time with friends and family. Minimize exposure to UV radiation to reduce your risk of skin cancer. Safety Always wear your seat belt while driving or riding in a vehicle. Do not drive: If you have been drinking alcohol. Do not ride with someone who has been drinking. When you are tired or distracted. While texting. If you have been using any mind-altering substances or drugs. Wear a helmet and other protective equipment during sports activities. If you have firearms in your house, make sure you follow all gun safety procedures. What's next? Go to your health care provider once a year for an annual wellness visit. Ask your health care provider how often you should have your eyes and teeth checked. Stay up to date on all vaccines. This information is not intended to replace advice given to you by your health care provider. Make sure you discuss any questions you have with your health care provider. Document Revised: 09/09/2020 Document Reviewed: 09/09/2020 Elsevier Patient Education  2023 [employer].

## 2021-09-24 NOTE — Addendum Note (Signed)
Addended by: Mervin Kung A on: 09/24/2021 02:34 PM   Modules accepted: Orders

## 2021-09-25 LAB — PSA: PSA: 0.23 ng/mL (ref <=4.00)

## 2021-09-25 LAB — CBC WITH DIFFERENTIAL/PLATELET
Absolute Monocytes: 553 cells/uL (ref 200–950)
Basophils Absolute: 51 cells/uL (ref 0–200)
Basophils Relative: 0.9 %
Eosinophils Absolute: 302 cells/uL (ref 15–500)
Eosinophils Relative: 5.3 %
HCT: 42.2 % (ref 38.5–50.0)
Hemoglobin: 14.6 g/dL (ref 13.2–17.1)
Lymphs Abs: 2206 cells/uL (ref 850–3900)
MCH: 30.8 pg (ref 27.0–33.0)
MCHC: 34.6 g/dL (ref 32.0–36.0)
MCV: 89 fL (ref 80.0–100.0)
MPV: 9.4 fL (ref 7.5–12.5)
Monocytes Relative: 9.7 %
Neutro Abs: 2588 cells/uL (ref 1500–7800)
Neutrophils Relative %: 45.4 %
Platelets: 258 10*3/uL (ref 140–400)
RBC: 4.74 10*6/uL (ref 4.20–5.80)
RDW: 12.7 % (ref 11.0–15.0)
Total Lymphocyte: 38.7 %
WBC: 5.7 10*3/uL (ref 3.8–10.8)

## 2021-09-25 LAB — COMPREHENSIVE METABOLIC PANEL
AG Ratio: 1.7 (calc) (ref 1.0–2.5)
ALT: 30 U/L (ref 9–46)
AST: 22 U/L (ref 10–35)
Albumin: 4.5 g/dL (ref 3.6–5.1)
Alkaline phosphatase (APISO): 56 U/L (ref 35–144)
BUN: 12 mg/dL (ref 7–25)
CO2: 27 mmol/L (ref 20–32)
Calcium: 9.5 mg/dL (ref 8.6–10.3)
Chloride: 104 mmol/L (ref 98–110)
Creat: 1.1 mg/dL (ref 0.70–1.30)
Globulin: 2.6 g/dL (calc) (ref 1.9–3.7)
Glucose, Bld: 84 mg/dL (ref 65–99)
Potassium: 4.2 mmol/L (ref 3.5–5.3)
Sodium: 138 mmol/L (ref 135–146)
Total Bilirubin: 0.6 mg/dL (ref 0.2–1.2)
Total Protein: 7.1 g/dL (ref 6.1–8.1)

## 2021-09-25 LAB — LIPID PANEL
Cholesterol: 209 mg/dL — ABNORMAL HIGH (ref <200)
HDL: 41 mg/dL (ref >=40)
LDL Cholesterol (Calc): 144 mg/dL (calc) — ABNORMAL HIGH
Non-HDL Cholesterol (Calc): 168 mg/dL (calc) — ABNORMAL HIGH (ref <130)
Total CHOL/HDL Ratio: 5.1 (calc) — ABNORMAL HIGH (ref <5.0)
Triglycerides: 121 mg/dL (ref <150)
